# Patient Record
Sex: Female | Born: 1965 | Race: White | Hispanic: No | Marital: Single | Smoking: Never smoker
Health system: Southern US, Community
[De-identification: ages and names within clinical notes are randomized; demographics above are authoritative.]

## PROBLEM LIST (undated history)

## (undated) HISTORY — PX: TONSILLECTOMY: SUR1361

---

## 2016-01-29 ENCOUNTER — Emergency Department
Admission: EM | Admit: 2016-01-29 | Discharge: 2016-01-29 | Disposition: A | Discharge status: Home / Self Care | Payer: Managed Care, Other (non HMO) | Attending: Student | Admitting: Student

## 2016-01-29 ENCOUNTER — Emergency Department: Payer: Managed Care, Other (non HMO)

## 2016-01-29 ENCOUNTER — Encounter: Payer: Self-pay | Admitting: *Deleted

## 2016-01-29 DIAGNOSIS — Y92410 Unspecified street and highway as the place of occurrence of the external cause: Secondary | ICD-10-CM | POA: Insufficient documentation

## 2016-01-29 DIAGNOSIS — M5412 Radiculopathy, cervical region: Secondary | ICD-10-CM

## 2016-01-29 DIAGNOSIS — Y998 Other external cause status: Secondary | ICD-10-CM | POA: Insufficient documentation

## 2016-01-29 DIAGNOSIS — G44319 Acute post-traumatic headache, not intractable: Secondary | ICD-10-CM

## 2016-01-29 DIAGNOSIS — Y9389 Activity, other specified: Secondary | ICD-10-CM | POA: Diagnosis not present

## 2016-01-29 DIAGNOSIS — S0990XA Unspecified injury of head, initial encounter: Secondary | ICD-10-CM | POA: Diagnosis not present

## 2016-01-29 DIAGNOSIS — M542 Cervicalgia: Secondary | ICD-10-CM

## 2016-01-29 DIAGNOSIS — M5442 Lumbago with sciatica, left side: Secondary | ICD-10-CM

## 2016-01-29 DIAGNOSIS — Z880 Allergy status to penicillin: Secondary | ICD-10-CM | POA: Diagnosis not present

## 2016-01-29 MED ORDER — NAPROXEN 500 MG PO TABS
ORAL_TABLET | ORAL | Status: AC
  Administered 2016-01-29: 500 mg via ORAL
  Filled 2016-01-29: qty 1

## 2016-01-29 MED ORDER — NAPROXEN 500 MG PO TABS
500.0000 mg | ORAL_TABLET | Freq: Once | ORAL | Status: AC
  Administered 2016-01-29: 500 mg via ORAL
  Filled 2016-01-29: qty 1

## 2016-01-29 MED ORDER — TRAMADOL HCL 50 MG PO TABS: 50.0000 mg | ORAL_TABLET | Freq: Four times a day (QID) | ORAL | Status: DC | PRN

## 2016-01-29 MED ORDER — ONDANSETRON 8 MG PO TBDP
8.0000 mg | ORAL_TABLET | Freq: Once | ORAL | Status: AC
  Administered 2016-01-29: 8 mg via ORAL
  Filled 2016-01-29: qty 1

## 2016-01-29 MED ORDER — DIAZEPAM 5 MG PO TABS: 5.0000 mg | ORAL_TABLET | Freq: Three times a day (TID) | ORAL | Status: DC | PRN

## 2016-01-29 MED ORDER — MELOXICAM 15 MG PO TABS: 15.0000 mg | ORAL_TABLET | Freq: Every day | ORAL | Status: DC
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
## 2016-07-12 ENCOUNTER — Encounter: Payer: Self-pay | Admitting: Family Medicine

## 2016-07-27 ENCOUNTER — Encounter: Payer: Self-pay | Admitting: Physician Assistant

## 2016-07-27 ENCOUNTER — Ambulatory Visit: Payer: Self-pay | Admitting: Physician Assistant

## 2016-07-27 VITALS — BP 130/85 | HR 69 | Temp 99.1°F

## 2016-07-27 DIAGNOSIS — H1030 Unspecified acute conjunctivitis, unspecified eye: Secondary | ICD-10-CM

## 2016-07-27 DIAGNOSIS — Z9109 Other allergy status, other than to drugs and biological substances: Secondary | ICD-10-CM

## 2016-07-27 MED ORDER — MONTELUKAST SODIUM 10 MG PO TABS: 10.0000 mg | ORAL_TABLET | Freq: Every day | ORAL | 3 refills | Status: DC

## 2016-07-27 MED ORDER — METHYLPREDNISOLONE 4 MG PO TBPK: ORAL_TABLET | ORAL | 0 refills | Status: DC

## 2016-07-27 MED ORDER — TOBRAMYCIN 0.3 % OP SOLN: 2.0000 [drp] | OPHTHALMIC | 0 refills | Status: DC

## 2016-07-27 NOTE — Progress Notes (Addendum)
S: c/o runny nose, congestion, watery eyes, some sinus pressure, sx for about 2-3 weeks, started after cleaning person used clorox in building, sx decrease after leaving the building, even if only for a few minutes, denies fever/chills/body aches, cough, cp/sob, or v/d  O: vitals wnl, nad, perrl eomi, conjunctiva bright red with clear drainage, tms dull, nasal mucosa swollen and boggy, throat wnl, neck supple no lymph, lungs c t a, cv rrr  A: acute  Allergies, allergic conjunctivitis  P: saline nasal rinse, singulair, tobramycin, medrol dose pack, if not improving by Friday will call in opth prednisone drops  Pt called , eye is still red, called in prednisone eye drops

## 2016-08-02 MED ORDER — PREDNISOLONE ACETATE 1 % OP SUSP: 1.0000 [drp] | Freq: Four times a day (QID) | OPHTHALMIC | 0 refills | Status: DC

## 2016-08-02 NOTE — Addendum Note (Signed)
Addended by: Faythe GheeFISHER, SUSAN W on: 08/02/2016 02:07 PM   Modules accepted: Orders

## 2016-08-12 ENCOUNTER — Encounter: Payer: Self-pay | Admitting: Obstetrics and Gynecology

## 2016-08-19 ENCOUNTER — Encounter: Payer: Self-pay | Admitting: Physician Assistant

## 2016-08-19 ENCOUNTER — Ambulatory Visit: Payer: Self-pay | Admitting: Physician Assistant

## 2016-08-19 VITALS — BP 150/92 | HR 64 | Temp 98.9°F

## 2016-08-19 DIAGNOSIS — Z9109 Other allergy status, other than to drugs and biological substances: Secondary | ICD-10-CM

## 2016-08-19 MED ORDER — AZITHROMYCIN 250 MG PO TABS: ORAL_TABLET | ORAL | 0 refills | Status: DC

## 2016-08-19 NOTE — Progress Notes (Signed)
S: pt returns due to concerns of mold at work, was previously seen for an allergic reaction to chemicals and ?mold in work place, was told its not workers comp, Hospital doctorhvac person cleaned the units and she saw a lot of black and green goo coming out of them, states as soon as he cleaned it she got a little better, her eyes have cleared up but she still can't breath and gets really congested while at work, also gets headaches and blurred vision, states it doesn't happen anywhere else, only at work, is still having a lot of sinus pain and swelling, denies fever/chills, is still taking singulair, using prednisone eye drops once a day  O: vitals wnl, nad, perrl eom, + periorbital edema, tms clear, nasal mucosa red and swollen, throat wn, neck supple no lymph, lungs c t a, cv rrr,   A: environmental allergies, sinusitis  P: zpack, f/u with ENT, continue singulair, stop prednisone eye drops as she has been using them too long, discuss cncerns with Virl DiamondMichelle Zaiser even though I don't believe its a workers comp issue they still may want to check the air quality at her work station

## 2016-09-07 ENCOUNTER — Emergency Department
Admission: EM | Admit: 2016-09-07 | Discharge: 2016-09-07 | Disposition: A | Discharge status: Home / Self Care | Payer: Managed Care, Other (non HMO) | Attending: Emergency Medicine | Admitting: Emergency Medicine

## 2016-09-07 ENCOUNTER — Emergency Department: Payer: Managed Care, Other (non HMO)

## 2016-09-07 DIAGNOSIS — J321 Chronic frontal sinusitis: Secondary | ICD-10-CM

## 2016-09-07 DIAGNOSIS — Z792 Long term (current) use of antibiotics: Secondary | ICD-10-CM | POA: Diagnosis not present

## 2016-09-07 DIAGNOSIS — R55 Syncope and collapse: Secondary | ICD-10-CM | POA: Diagnosis not present

## 2016-09-07 DIAGNOSIS — R51 Headache: Secondary | ICD-10-CM | POA: Diagnosis present

## 2016-09-07 DIAGNOSIS — Z79899 Other long term (current) drug therapy: Secondary | ICD-10-CM | POA: Diagnosis not present

## 2016-09-07 LAB — BASIC METABOLIC PANEL
Anion gap: 10 (ref 5–15)
BUN: 19 mg/dL (ref 6–20)
CO2: 25 mmol/L (ref 22–32)
CREATININE: 0.73 mg/dL (ref 0.44–1.00)
Calcium: 9.4 mg/dL (ref 8.9–10.3)
Chloride: 103 mmol/L (ref 101–111)
Glucose, Bld: 91 mg/dL (ref 65–99)
Potassium: 3.7 mmol/L (ref 3.5–5.1)
Sodium: 138 mmol/L (ref 135–145)

## 2016-09-07 LAB — CBC WITH DIFFERENTIAL/PLATELET
BASOS ABS: 0.1 10*3/uL (ref 0–0.1)
BASOS PCT: 1 %
EOS ABS: 0.1 10*3/uL (ref 0–0.7)
EOS PCT: 2 %
HCT: 45.2 % (ref 35.0–47.0)
Hemoglobin: 16 g/dL (ref 12.0–16.0)
Lymphocytes Relative: 31 %
Lymphs Abs: 2 10*3/uL (ref 1.0–3.6)
MCH: 33 pg (ref 26.0–34.0)
MCHC: 35.4 g/dL (ref 32.0–36.0)
MCV: 93.2 fL (ref 80.0–100.0)
Monocytes Absolute: 0.5 10*3/uL (ref 0.2–0.9)
Monocytes Relative: 9 %
NEUTROS PCT: 57 %
Neutro Abs: 3.7 10*3/uL (ref 1.4–6.5)
PLATELETS: 266 10*3/uL (ref 150–440)
RBC: 4.85 MIL/uL (ref 3.80–5.20)
RDW: 13.1 % (ref 11.5–14.5)
WBC: 6.3 10*3/uL (ref 3.6–11.0)

## 2016-09-07 MED ORDER — LORAZEPAM 2 MG/ML IJ SOLN
1.0000 mg | Freq: Once | INTRAMUSCULAR | Status: AC
  Administered 2016-09-07: 1 mg via INTRAVENOUS
  Filled 2016-09-07: qty 1

## 2016-09-07 MED ORDER — PREDNISONE 20 MG PO TABS: 40.0000 mg | ORAL_TABLET | Freq: Every day | ORAL | 0 refills | Status: DC

## 2016-09-07 MED ORDER — KETOROLAC TROMETHAMINE 30 MG/ML IJ SOLN
15.0000 mg | INTRAMUSCULAR | Status: AC
  Administered 2016-09-07: 15 mg via INTRAVENOUS
  Filled 2016-09-07: qty 1

## 2016-09-07 MED ORDER — DOXYCYCLINE HYCLATE 100 MG PO CAPS: 100.0000 mg | ORAL_CAPSULE | Freq: Two times a day (BID) | ORAL | 0 refills | Status: DC

## 2016-09-07 MED ORDER — SODIUM CHLORIDE 0.9 % IV BOLUS (SEPSIS)
1000.0000 mL | Freq: Once | INTRAVENOUS | Status: AC
  Administered 2016-09-07: 1000 mL via INTRAVENOUS

## 2016-09-07 MED ORDER — ONDANSETRON 4 MG PO TBDP: 4.0000 mg | ORAL_TABLET | Freq: Three times a day (TID) | ORAL | 0 refills | Status: DC | PRN

## 2016-09-07 NOTE — ED Triage Notes (Signed)
Pt bib EMS from work w/ c/o syncopal episode.  Per EMS, pt has been experiencing incr dizziness/"fogginess" at work.  Pt sts that she has mold in her workspace that has not been dealt w/ and attributes s/s to that mold.  Pt alert and oriented in triage.  Pts syncopal episode was witnessed by coworkers, no head injury.
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
## 2016-09-07 NOTE — Progress Notes (Signed)
Patient ID: Tiffany Pope, female   DOB: 06/30/1966, 50 y.o.   MRN: [id number] Patient has been referred to see Dr. Andee PolesVaught at [facility] on 09/09/2016 @ 9am.  I have left a message with the appointment on the patient's voicemail.

## 2019-07-06 ENCOUNTER — Emergency Department: Payer: Self-pay

## 2019-07-06 ENCOUNTER — Other Ambulatory Visit: Payer: Self-pay

## 2019-07-06 ENCOUNTER — Emergency Department
Admission: EM | Admit: 2019-07-06 | Discharge: 2019-07-06 | Disposition: A | Discharge status: Home / Self Care | Payer: Self-pay | Attending: Emergency Medicine | Admitting: Emergency Medicine

## 2019-07-06 DIAGNOSIS — Z79899 Other long term (current) drug therapy: Secondary | ICD-10-CM | POA: Insufficient documentation

## 2019-07-06 DIAGNOSIS — R42 Dizziness and giddiness: Secondary | ICD-10-CM | POA: Insufficient documentation

## 2019-07-06 LAB — BASIC METABOLIC PANEL
Anion gap: 9 (ref 5–15)
BUN: 14 mg/dL (ref 6–20)
CO2: 23 mmol/L (ref 22–32)
Calcium: 9.1 mg/dL (ref 8.9–10.3)
Chloride: 107 mmol/L (ref 98–111)
Creatinine, Ser: 0.66 mg/dL (ref 0.44–1.00)
GFR calc Af Amer: >60 mL/min (ref >60)
GFR calc non Af Amer: >60 mL/min (ref >60)
Glucose, Bld: 114 mg/dL — ABNORMAL HIGH (ref 70–99)
Potassium: 3.7 mmol/L (ref 3.5–5.1)
Sodium: 139 mmol/L (ref 135–145)

## 2019-07-06 LAB — URINALYSIS, COMPLETE (UACMP) WITH MICROSCOPIC
Bilirubin Urine: NEGATIVE
Glucose, UA: NEGATIVE mg/dL
Hgb urine dipstick: NEGATIVE
Ketones, ur: 5 mg/dL — AB
Leukocytes,Ua: NEGATIVE
Nitrite: NEGATIVE
Protein, ur: NEGATIVE mg/dL
Specific Gravity, Urine: 1.027 (ref 1.005–1.030)
pH: 5 (ref 5.0–8.0)

## 2019-07-06 LAB — CBC
HCT: 43.1 % (ref 36.0–46.0)
Hemoglobin: 15.2 g/dL — ABNORMAL HIGH (ref 12.0–15.0)
MCH: 32.1 pg (ref 26.0–34.0)
MCHC: 35.3 g/dL (ref 30.0–36.0)
MCV: 90.9 fL (ref 80.0–100.0)
Platelets: 273 10*3/uL (ref 150–400)
RBC: 4.74 MIL/uL (ref 3.87–5.11)
RDW: 12.3 % (ref 11.5–15.5)
WBC: 5.4 10*3/uL (ref 4.0–10.5)
nRBC: 0 % (ref 0.0–0.2)

## 2019-07-06 LAB — TROPONIN I (HIGH SENSITIVITY): Troponin I (High Sensitivity): <2 ng/L (ref <18)

## 2019-07-06 MED ORDER — ASPIRIN EC 81 MG PO TBEC
81.0000 mg | DELAYED_RELEASE_TABLET | Freq: Once | ORAL | Status: AC
  Administered 2019-07-06: 81 mg via ORAL
  Filled 2019-07-06: qty 1

## 2019-07-06 NOTE — ED Notes (Signed)
Pt states she has completely cut out sugars and carbs within the last two weeks.
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
## 2019-07-06 NOTE — ED Notes (Signed)
Reports extreme fatigue the last couple of weeks. Unable to stand the heat. Cut out carbs, sugars, and dairy X2 weeks ago.

## 2019-07-06 NOTE — Discharge Instructions (Signed)
Follow-up by setting up an appointment with her primary care doctor and cardiologist  Stay well-hydrated.  Return to ER right away should he notice weakness or numbness in the arm, face or legs, chest pain, severe headache, feel as though he could pass out once again, or other new concerns arise.  I would recommend starting a baby aspirin 81 mg by mouth once daily at least until you have had to follow-up with a primary care and cardiologist.

## 2019-07-06 NOTE — ED Notes (Signed)
Pt screened for MRI. Denies needing meds for claustrophobia

## 2019-07-06 NOTE — ED Triage Notes (Signed)
First Nurse Note: Pt reports dizziness and dry mouth since this AM. Pt also reports her throat has been constricting in a spasm manner. Pt A&O, airway intact, speaking in complete sentences with no difficulty or distress.

## 2020-12-29 ENCOUNTER — Other Ambulatory Visit: Payer: Self-pay

## 2020-12-29 ENCOUNTER — Emergency Department
Admission: EM | Admit: 2020-12-29 | Discharge: 2020-12-29 | Disposition: A | Discharge status: Home / Self Care | Payer: No Typology Code available for payment source | Attending: Student in an Organized Health Care Education/Training Program | Admitting: Student in an Organized Health Care Education/Training Program

## 2020-12-29 ENCOUNTER — Encounter: Payer: Self-pay | Admitting: Emergency Medicine

## 2020-12-29 DIAGNOSIS — R059 Cough, unspecified: Secondary | ICD-10-CM | POA: Insufficient documentation

## 2020-12-29 DIAGNOSIS — R42 Dizziness and giddiness: Secondary | ICD-10-CM | POA: Insufficient documentation

## 2020-12-29 DIAGNOSIS — R070 Pain in throat: Secondary | ICD-10-CM | POA: Diagnosis present

## 2020-12-29 DIAGNOSIS — R519 Headache, unspecified: Secondary | ICD-10-CM | POA: Diagnosis not present

## 2020-12-29 DIAGNOSIS — H5710 Ocular pain, unspecified eye: Secondary | ICD-10-CM | POA: Insufficient documentation

## 2020-12-29 DIAGNOSIS — Z5321 Procedure and treatment not carried out due to patient leaving prior to being seen by health care provider: Secondary | ICD-10-CM | POA: Diagnosis not present

## 2020-12-29 DIAGNOSIS — R0789 Other chest pain: Secondary | ICD-10-CM | POA: Insufficient documentation

## 2020-12-29 LAB — BASIC METABOLIC PANEL
Anion gap: 9 (ref 5–15)
BUN: 19 mg/dL (ref 6–20)
CO2: 27 mmol/L (ref 22–32)
Calcium: 9.4 mg/dL (ref 8.9–10.3)
Chloride: 102 mmol/L (ref 98–111)
Creatinine, Ser: 0.71 mg/dL (ref 0.44–1.00)
GFR, Estimated: >60 mL/min (ref >60)
Glucose, Bld: 136 mg/dL — ABNORMAL HIGH (ref 70–99)
Potassium: 4.1 mmol/L (ref 3.5–5.1)
Sodium: 138 mmol/L (ref 135–145)

## 2020-12-29 LAB — CBC
HCT: 44.5 % (ref 36.0–46.0)
Hemoglobin: 15.6 g/dL — ABNORMAL HIGH (ref 12.0–15.0)
MCH: 32.4 pg (ref 26.0–34.0)
MCHC: 35.1 g/dL (ref 30.0–36.0)
MCV: 92.5 fL (ref 80.0–100.0)
Platelets: 265 10*3/uL (ref 150–400)
RBC: 4.81 MIL/uL (ref 3.87–5.11)
RDW: 11.9 % (ref 11.5–15.5)
WBC: 5.1 10*3/uL (ref 4.0–10.5)
nRBC: 0 % (ref 0.0–0.2)

## 2020-12-29 NOTE — ED Triage Notes (Signed)
Pt to ER states that she has been working from room and has just recently returned to the office.  States that at night they are fogging the office as part of a COVID protocol.  States that when she goes to the office she begins to have burning of her eyes and throat, cough, headache and today had foggy headedness and chest discomfort.  States her supervisor asked her to come and get evaluated.

## 2021-01-22 IMAGING — MR MRI HEAD WITHOUT CONTRAST
9 of 10 series · 42 of 48 positions shown · non-contrast
Comparison: CT head 09/07/2016.

CLINICAL DATA: 52-year-old female with dizziness, left arm
weakness, numbness and tingling.

EXAM:
MRI HEAD WITHOUT CONTRAST
TECHNIQUE: Multiplanar, multiecho pulse sequences of the brain and surrounding
structures were obtained without intravenous contrast.

[Series 2: ax dwi_tracew · axial · 3.0mm · 0.83mm/px · z∈[-114,+48]mm · 7 of 55 slices shown]
[im 1/55]
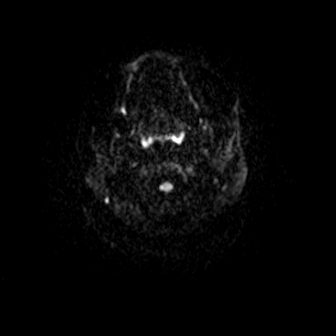
[im 10/55]
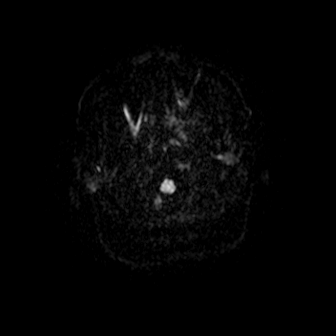
[im 19/55]
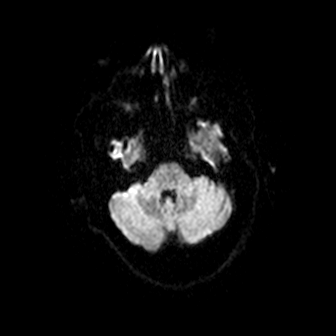
[im 28/55]
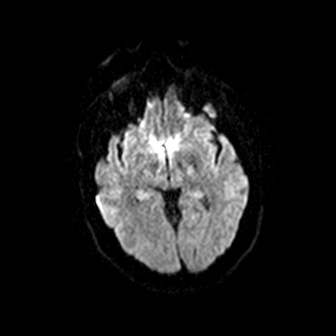
[im 37/55]
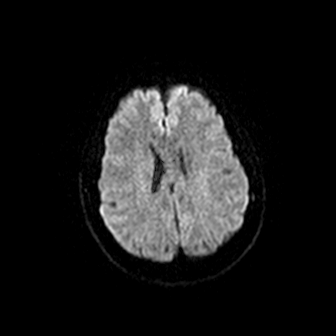
[im 46/55]
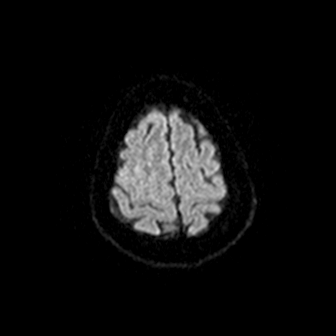
[im 55/55]
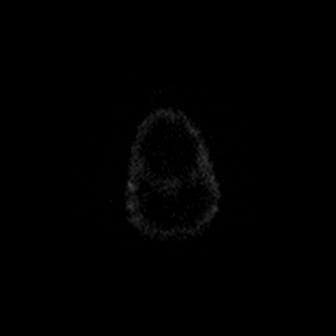

[Series 3: ax dwi_adc · axial · 3.0mm · 0.83mm/px · z∈[-114,+48]mm · 8 of 55 slices shown]
[im 1/55]
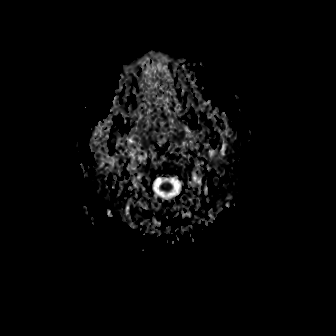
[im 8/55]
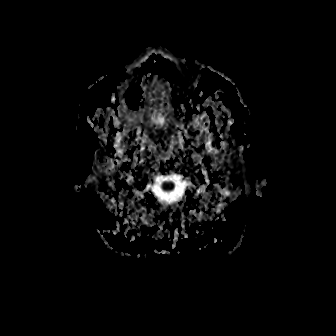
[im 16/55]
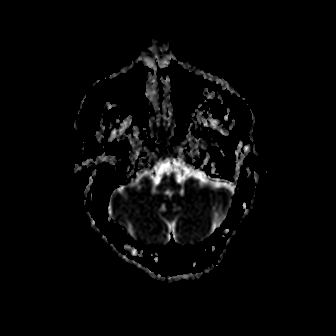
[im 24/55]
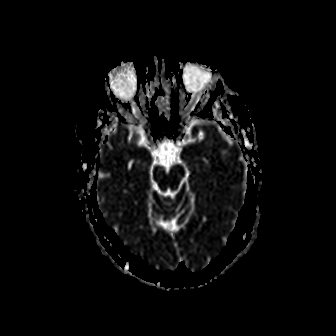
[im 31/55]
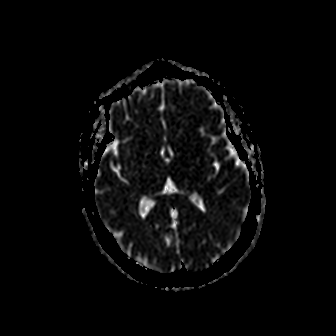
[im 39/55]
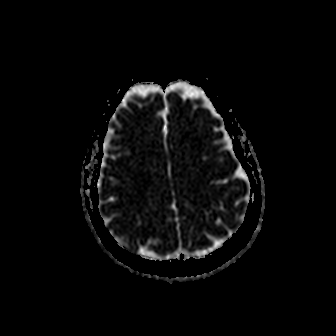
[im 47/55]
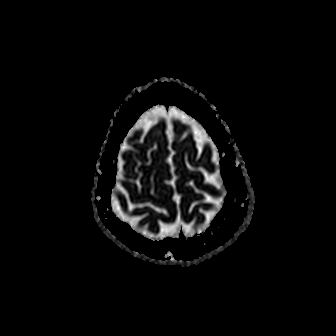
[im 55/55]
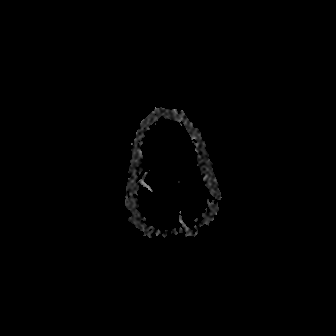

[Series 4: cor dwi_tracew · coronal · 5.0mm · 0.68mm/px · 5 of 38 slices shown]
[im 1/38]
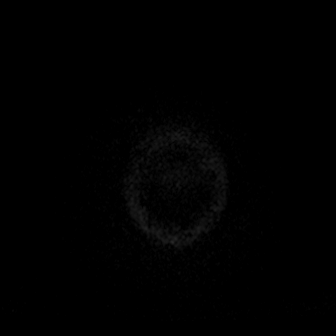
[im 10/38]
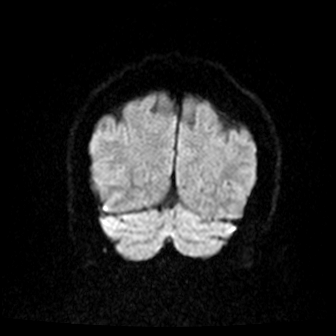
[im 19/38]
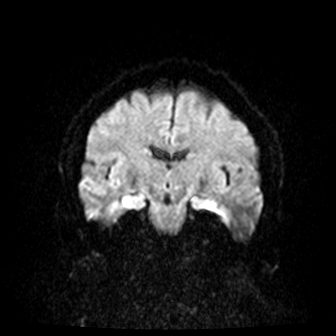
[im 28/38]
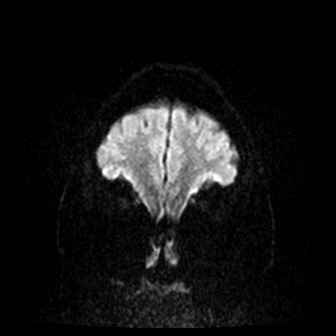
[im 38/38]
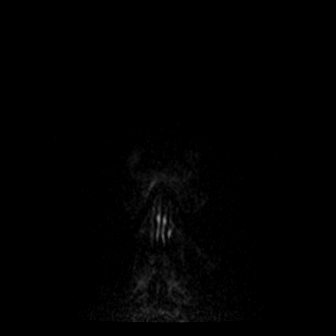

[Series 5: cor dwi_adc · coronal · 5.0mm · 0.68mm/px · 3 of 38 slices shown]
[im 1/38]
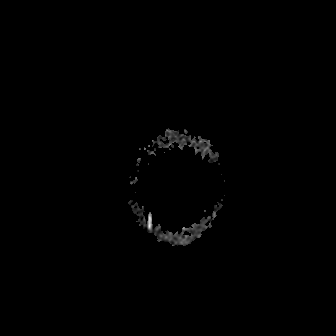
[im 10/38]
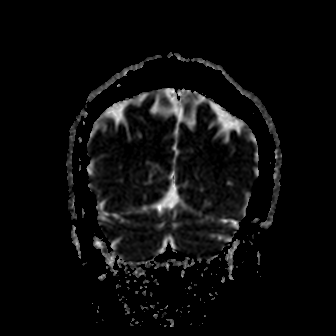
[im 19/38]
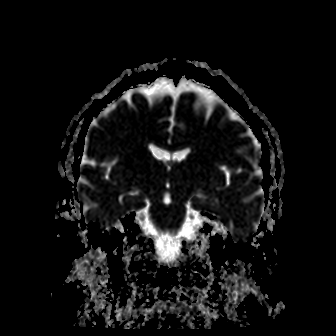

[Series 6: T1 · sagittal · 5.0mm · 0.94mm/px · 3 of 22 slices shown (1 of 2)]
[im 1/22]
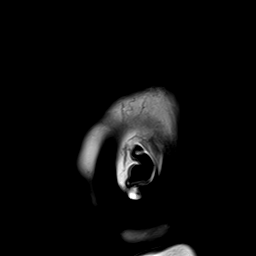
[im 11/22]
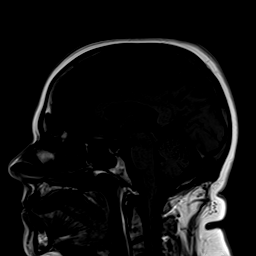
[im 22/22]
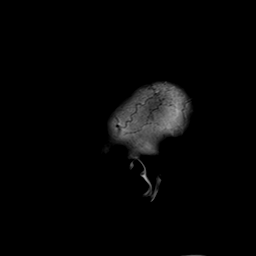

[Series 7: T2 · axial · 5.0mm · 0.45mm/px · z∈[-110,+46]mm · 4 of 27 slices shown (1 of 2)]
[im 1/27]
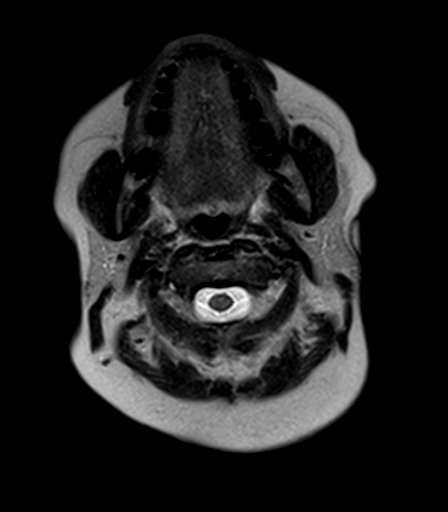
[im 9/27]
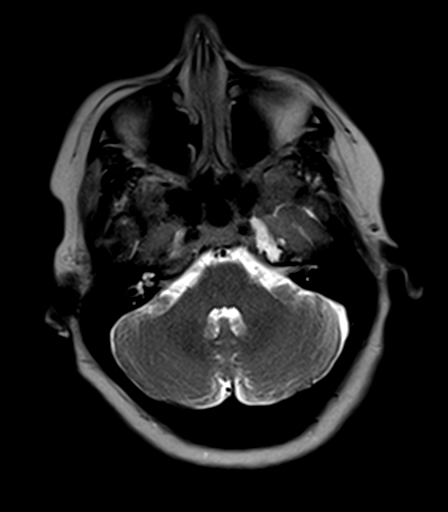
[im 18/27]
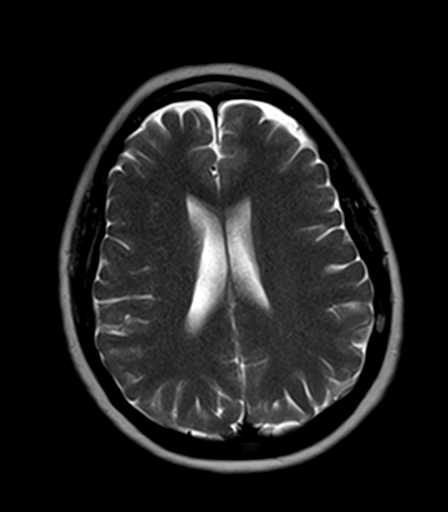
[im 27/27]
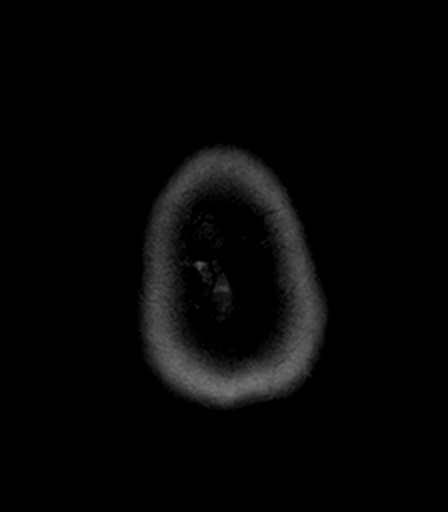

[Series 9: FLAIR · axial · 5.0mm · 1.20mm/px · z∈[-110,+46]mm · 4 of 27 slices shown]
[im 1/27]
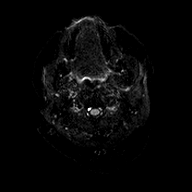
[im 9/27]
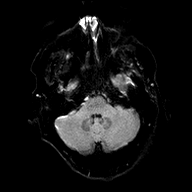
[im 18/27]
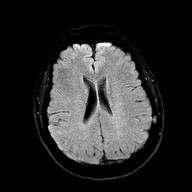
[im 27/27]
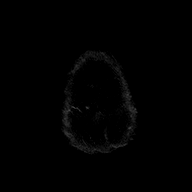

[Series 10: T1 · axial · 5.0mm · 0.90mm/px · z∈[-110,+46]mm · 4 of 27 slices shown (2 of 2)]
[im 1/27]
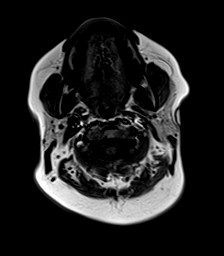
[im 9/27]
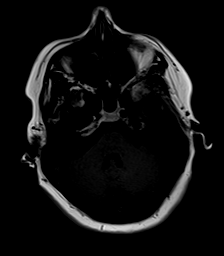
[im 18/27]
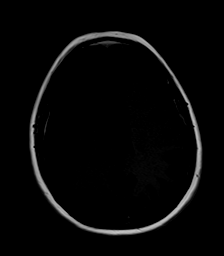
[im 27/27]
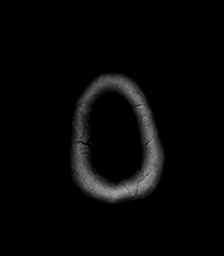

[Series 11: T2 · coronal · 5.0mm · 0.45mm/px · 4 of 31 slices shown (2 of 2)]
[im 1/31]
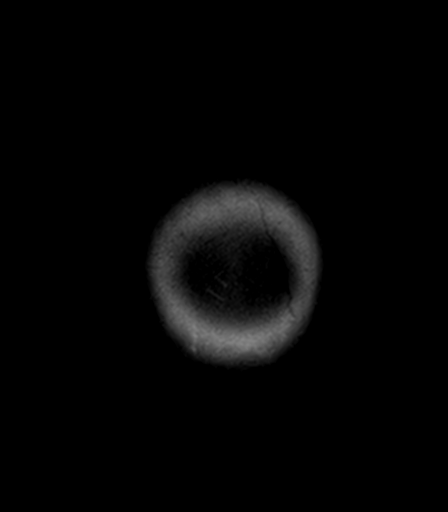
[im 11/31]
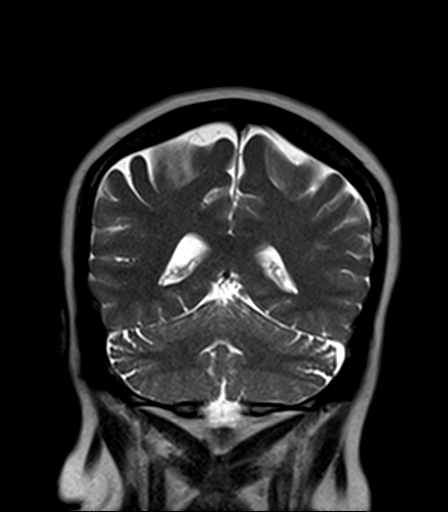
[im 21/31]
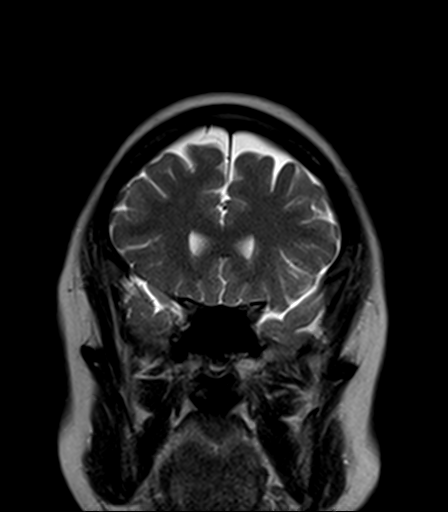
[im 31/31]
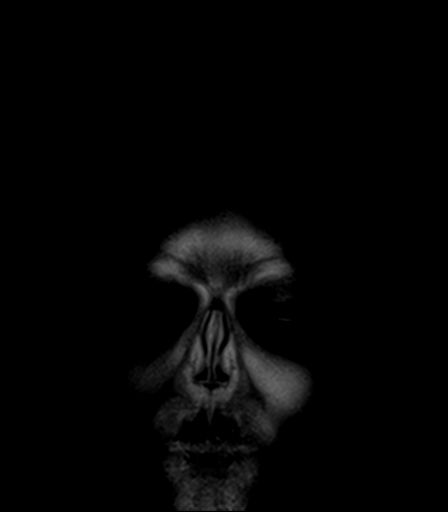

[42 of 48 positions shown; findings below may reference images not displayed]

FINDINGS: Brain: No restricted diffusion to suggest acute infarction. No
midline shift, mass effect, evidence of mass lesion,
ventriculomegaly, extra-axial collection or acute intracranial
hemorrhage. Cervicomedullary junction within normal limits.

Gray and white matter signal is within normal limits for age
throughout the brain. No encephalomalacia or chronic blood products
identified. Partially empty sella.

Vascular: Major intracranial vascular flow voids are preserved.

Skull and upper cervical spine: Negative visible cervical spine.
Visualized bone marrow signal is within normal limits.

Sinuses/Orbits: Negative orbits. Mild maxillary sinus mucosal
thickening.

Other: Mastoids are clear. Grossly negative visible internal
auditory structures. Scalp and face soft tissues appear negative.
IMPRESSION: Normal noncontrast MRI appearance of the brain.

## 2021-04-17 ENCOUNTER — Other Ambulatory Visit: Payer: Self-pay

## 2021-04-17 ENCOUNTER — Other Ambulatory Visit (HOSPITAL_COMMUNITY)
Admission: RE | Admit: 2021-04-17 | Discharge: 2021-04-17 | Disposition: A | Discharge status: Home / Self Care | Payer: Medicaid Other | Source: Ambulatory Visit | Attending: Advanced Practice Midwife | Admitting: Advanced Practice Midwife

## 2021-04-17 ENCOUNTER — Encounter: Payer: Self-pay | Admitting: Advanced Practice Midwife

## 2021-04-17 ENCOUNTER — Ambulatory Visit (INDEPENDENT_AMBULATORY_CARE_PROVIDER_SITE_OTHER): Payer: Medicaid Other | Admitting: Advanced Practice Midwife

## 2021-04-17 VITALS — BP 144/90 | Ht 65.0 in | Wt 264.0 lb

## 2021-04-17 DIAGNOSIS — Z01419 Encounter for gynecological examination (general) (routine) without abnormal findings: Secondary | ICD-10-CM | POA: Diagnosis not present

## 2021-04-17 DIAGNOSIS — Z124 Encounter for screening for malignant neoplasm of cervix: Secondary | ICD-10-CM

## 2021-04-17 DIAGNOSIS — Z1211 Encounter for screening for malignant neoplasm of colon: Secondary | ICD-10-CM

## 2021-04-17 DIAGNOSIS — L659 Nonscarring hair loss, unspecified: Secondary | ICD-10-CM

## 2021-04-17 DIAGNOSIS — Z13220 Encounter for screening for lipoid disorders: Secondary | ICD-10-CM

## 2021-04-17 DIAGNOSIS — Z131 Encounter for screening for diabetes mellitus: Secondary | ICD-10-CM

## 2021-04-17 DIAGNOSIS — N941 Unspecified dyspareunia: Secondary | ICD-10-CM

## 2021-04-17 DIAGNOSIS — Z1239 Encounter for other screening for malignant neoplasm of breast: Secondary | ICD-10-CM

## 2021-04-17 DIAGNOSIS — N951 Menopausal and female climacteric states: Secondary | ICD-10-CM

## 2021-04-17 MED ORDER — CLIMARA PRO 0.045-0.015 MG/DAY TD PTWK: 1.0000 | MEDICATED_PATCH | TRANSDERMAL | 3 refills | Status: DC

## 2021-04-18 LAB — LIPID PANEL WITH LDL/HDL RATIO
Cholesterol, Total: 297 mg/dL — ABNORMAL HIGH (ref 100–199)
HDL: 41 mg/dL (ref >39)
LDL Chol Calc (NIH): 215 mg/dL — ABNORMAL HIGH (ref 0–99)
LDL/HDL Ratio: 5.2 ratio — ABNORMAL HIGH (ref 0.0–3.2)
Triglycerides: 211 mg/dL — ABNORMAL HIGH (ref 0–149)
VLDL Cholesterol Cal: 41 mg/dL — ABNORMAL HIGH (ref 5–40)

## 2021-04-18 LAB — CBC
Hematocrit: 43.9 % (ref 34.0–46.6)
Hemoglobin: 15 g/dL (ref 11.1–15.9)
MCH: 31.5 pg (ref 26.6–33.0)
MCHC: 34.2 g/dL (ref 31.5–35.7)
MCV: 92 fL (ref 79–97)
Platelets: 301 10*3/uL (ref 150–450)
RBC: 4.76 x10E6/uL (ref 3.77–5.28)
RDW: 13.2 % (ref 11.7–15.4)
WBC: 5.9 10*3/uL (ref 3.4–10.8)

## 2021-04-18 LAB — TSH: TSH: 3.69 u[IU]/mL (ref 0.450–4.500)

## 2021-04-18 LAB — HGB A1C W/O EAG: Hgb A1c MFr Bld: 5.4 % (ref 4.8–5.6)

## 2021-04-20 ENCOUNTER — Other Ambulatory Visit: Payer: Self-pay | Admitting: Advanced Practice Midwife

## 2021-04-20 DIAGNOSIS — Z1231 Encounter for screening mammogram for malignant neoplasm of breast: Secondary | ICD-10-CM

## 2021-04-20 NOTE — Progress Notes (Signed)
Gynecology Annual Exam  Date of Service: 04/17/2021  PCP: Patient, No Pcp Per (Inactive)  Chief Complaint:  Chief Complaint  Patient presents with  . Gynecologic Exam    Weight gain, hot flashes    History of Present Illness:Patient is a 55 y.o. G2P2002 presents for annual exam. The patient has complaint today of weight gain and "no matter what I do I am unable to lose weight". She has a past history of hypothyroid and requests thyroid function test along with other routine labs today. Her last PAP was around 4 or 5 years ago and normal per her report. She has previously had her gyn care with [facility] and then moved to [place]. She reports an increase in stress in the past year which is improving. She is having hot flashes that are interfering with her quality of life and she requests HRT. She did use HRT in 2019 and found it to be helpful.     LMP: No LMP recorded. Patient is postmenopausal.   The patient is sexually active. She admits some vaginal dryness and uses coconut oil for lubrication.  The patient does perform self breast exams.  There is no notable family history of breast or ovarian cancer in her family.  The patient wears seatbelts: yes.   The patient has regular exercise: she walks some and hikes on weekends. She is following a keto diet and primarily drinks water and occasional diet soda. She usually sleeps about 5.5 hours.    The patient denies current symptoms of depression.     Review of Systems: Review of Systems  Constitutional: Negative for chills and fever.  HENT: Negative for congestion, ear discharge, ear pain, hearing loss, sinus pain and sore throat.   Eyes: Negative for blurred vision and double vision.  Respiratory: Negative for cough, shortness of breath and wheezing.        Positive for sneezing  Cardiovascular: Negative for chest pain, palpitations and leg swelling.  Gastrointestinal: Negative for abdominal pain, blood in stool, constipation,  diarrhea, heartburn, melena, nausea and vomiting.  Genitourinary: Negative for dysuria, flank pain, frequency, hematuria and urgency.  Musculoskeletal: Positive for joint pain. Negative for back pain and myalgias.       Positive for joint swelling  Skin: Negative for itching and rash.  Neurological: Positive for headaches. Negative for dizziness, tingling, tremors, sensory change, speech change, focal weakness, seizures, loss of consciousness and weakness.  Endo/Heme/Allergies: Negative for environmental allergies. Does not bruise/bleed easily.  Psychiatric/Behavioral: Negative for depression, hallucinations, memory loss, substance abuse and suicidal ideas. The patient is not nervous/anxious and does not have insomnia.     Past Medical History:  There are no problems to display for this patient.   Past Surgical History:  Past Surgical History:  Procedure Laterality Date  . CESAREAN SECTION    . TONSILLECTOMY      Gynecologic History:  No LMP recorded. Patient is postmenopausal. Last Pap: 4 or 5 years ago Results were:  no abnormalities  Last mammogram: 6 years ago Results were: BI-RAD I per patient report  Obstetric History: [id number]  Family History:  History reviewed. No pertinent family history.  Social History:  Social History   Socioeconomic History  . Marital status: Single    Spouse name: Not on file  . Number of children: Not on file  . Years of education: Not on file  . Highest education level: Not on file  Occupational History  . Not on file  Tobacco  Use  . Smoking status: Never Smoker  . Smokeless tobacco: Never Used  Vaping Use  . Vaping Use: Never used  Substance and Sexual Activity  . Alcohol use: No  . Drug use: Never  . Sexual activity: Yes    Birth control/protection: Post-menopausal  Other Topics Concern  . Not on file  Social History Narrative  . Not on file   Social Determinants of Health   Financial Resource Strain: Not on file  Food  Insecurity: Not on file  Transportation Needs: Not on file  Physical Activity: Not on file  Stress: Not on file  Social Connections: Not on file  Intimate Partner Violence: Not on file    Allergies:  Allergies  Allergen Reactions  . Molds & Smuts   . Penicillins Other (See Comments)    Medications: Prior to Admission medications   Medication Sig Start Date End Date Taking? Authorizing Provider  estradiol-levonorgestrel (CLIMARA PRO) 0.045-0.015 MG/DAY Place 1 patch onto the skin once a week. 04/17/21  Yes Tresea Mall, CNM    Physical Exam Vitals: Blood pressure (!) 144/90, height 5\' 5"  (1.651 m), weight 264 lb (119.7 kg).  General: NAD HEENT: normocephalic, anicteric Thyroid: no enlargement, no palpable nodules Pulmonary: No increased work of breathing, CTAB Cardiovascular: RRR, distal pulses 2+ Breast: Breast symmetrical, no tenderness, no palpable nodules or masses, no skin or nipple retraction present, no nipple discharge.  No axillary or supraclavicular lymphadenopathy. Abdomen: NABS, soft, non-tender, non-distended.  Umbilicus without lesions.  No hepatomegaly, splenomegaly or masses palpable. No evidence of hernia  Genitourinary:  External: Normal external female genitalia.  Normal urethral meatus, normal Bartholin's and Skene's glands.    Vagina: Normal vaginal mucosa, no evidence of prolapse.    Cervix: Grossly normal in appearance, no bleeding  Uterus: Non-enlarged, mobile, normal contour.  No CMT  Adnexa: ovaries non-enlarged, no adnexal masses  Rectal: deferred  Lymphatic: no evidence of inguinal lymphadenopathy Extremities: no edema, erythema, or tenderness Neurologic: Grossly intact Psychiatric: mood appropriate, affect full    Assessment: 55 y.o. G2P2002 routine annual exam  Plan: Problem List Items Addressed This Visit   None   Visit Diagnoses    Well woman exam with routine gynecological exam    -  Primary   Relevant Medications    estradiol-levonorgestrel (CLIMARA PRO) 0.045-0.015 MG/DAY   Other Relevant Orders   MM DIGITAL SCREENING BILATERAL   Ambulatory referral to Gastroenterology   TSH (Completed)   Hgb A1c w/o eAG (Completed)   CBC (Completed)   Lipid Panel With LDL/HDL Ratio (Completed)   Cytology - PAP   Breast screening       Relevant Orders   MM DIGITAL SCREENING BILATERAL   Screening for cervical cancer       Relevant Orders   Cytology - PAP   Colon cancer screening       Relevant Orders   Ambulatory referral to Gastroenterology   Screening cholesterol level       Relevant Orders   Lipid Panel With LDL/HDL Ratio (Completed)   Screening for diabetes mellitus       Relevant Orders   Hgb A1c w/o eAG (Completed)   Hair thinning       Relevant Orders   TSH (Completed)   Hot flashes due to menopause       Relevant Medications   estradiol-levonorgestrel (CLIMARA PRO) 0.045-0.015 MG/DAY   Dyspareunia in female       Relevant Medications   estradiol-levonorgestrel (CLIMARA PRO) 0.045-0.015 MG/DAY  1) Mammogram - recommend yearly screening mammogram.  Mammogram Was ordered today  2) STI screening  was offered and declined  3) ASCCP guidelines and rationale discussed.  Patient opts for every 3 years screening interval  4) Osteoporosis  - per USPTF routine screening DEXA at age 31  Consider FDA-approved medical therapies in postmenopausal women and men aged 57 years and older, based on the following: a) A hip or vertebral (clinical or morphometric) fracture b) T-score ? -2.5 at the femoral neck or spine after appropriate evaluation to exclude secondary causes C) Low bone mass (T-score between -1.0 and -2.5 at the femoral neck or spine) and a 10-year probability of a hip fracture ? 3% or a 10-year probability of a major osteoporosis-related fracture ? 20% based on the [place]-adapted WHO algorithm  5) Routine healthcare maintenance including cholesterol, diabetes screening discussed Ordered  today  6) Colonoscopy: referral sent today.  Screening recommended starting at age 70 for average risk individuals, age 12 for individuals deemed at increased risk (including African Americans) and recommended to continue until age 33.  For patient age 68-85 individualized approach is recommended.  Gold standard screening is via colonoscopy, Cologuard screening is an acceptable alternative for patient unwilling or unable to undergo colonoscopy.  "Colorectal cancer screening for average?risk adults: 2018 guideline update from the American Cancer Society"CA: A Cancer Journal for Clinicians: May 25, 2017   7) Return in about 1 year (around 04/17/2022) for annual established gyn.    Parke Poisson, CNM [facility] 04/20/21, 9:32 AM

## 2021-04-22 LAB — CYTOLOGY - PAP
Comment: NEGATIVE
Diagnosis: NEGATIVE
High risk HPV: NEGATIVE

## 2022-09-22 ENCOUNTER — Encounter: Payer: Self-pay | Admitting: Advanced Practice Midwife

## 2022-09-22 ENCOUNTER — Ambulatory Visit (INDEPENDENT_AMBULATORY_CARE_PROVIDER_SITE_OTHER): Payer: No Typology Code available for payment source | Admitting: Advanced Practice Midwife

## 2022-09-22 VITALS — BP 126/84 | Ht 65.0 in | Wt 234.0 lb

## 2022-09-22 DIAGNOSIS — Z0000 Encounter for general adult medical examination without abnormal findings: Secondary | ICD-10-CM

## 2022-09-22 DIAGNOSIS — Z131 Encounter for screening for diabetes mellitus: Secondary | ICD-10-CM

## 2022-09-22 DIAGNOSIS — Z77120 Contact with and (suspected) exposure to mold (toxic): Secondary | ICD-10-CM | POA: Diagnosis not present

## 2022-09-22 DIAGNOSIS — G479 Sleep disorder, unspecified: Secondary | ICD-10-CM

## 2022-09-22 DIAGNOSIS — Z13220 Encounter for screening for lipoid disorders: Secondary | ICD-10-CM

## 2022-09-22 DIAGNOSIS — Z1239 Encounter for other screening for malignant neoplasm of breast: Secondary | ICD-10-CM

## 2022-09-22 NOTE — Progress Notes (Signed)
Gynecology Annual Exam  PCP: Patient, No Pcp Per  Chief Complaint:  Chief Complaint  Patient presents with   Annual Exam    History of Present Illness:Patient is a 56 y.o. G2P2002 presents for annual exam. The patient has no gyn/breast complaints today. She reports her main concern is a severe allergic reaction she had in July she thinks from mold. Her eyes were swollen shut initially. She has taken a course of prednisone and is now using steroid eye drops. She is interested in serum testing for mold exposure. She also reports waking up multiple times during the night and feeling not well rested. Since her allergic reaction she has limited exercise and exposure to sunlight. She mentions her history of hypothyroid and request thyroid panel today.   LMP: No LMP recorded. Patient is postmenopausal. She denies bleeding  The patient is sexually active. She denies dyspareunia.  The patient does perform self breast exams.  There is no notable family history of breast or ovarian cancer in her family.  The patient wears seatbelts: yes.   The patient has regular exercise:  limited for the past several months, she admits healthy diet and adequate hydration.  She supplements selenium, calcium, vitamin D and C.   The patient denies current symptoms of depression.     Review of Systems: Review of Systems  Constitutional:  Positive for malaise/fatigue. Negative for chills and fever.  HENT:  Negative for congestion, ear discharge, ear pain, hearing loss, sinus pain and sore throat.   Eyes:  Negative for blurred vision and double vision.  Respiratory:  Negative for cough, shortness of breath and wheezing.   Cardiovascular:  Negative for chest pain, palpitations and leg swelling.  Gastrointestinal:  Negative for abdominal pain, blood in stool, constipation, diarrhea, heartburn, melena, nausea and vomiting.  Genitourinary:  Negative for dysuria, flank pain, frequency, hematuria and urgency.   Musculoskeletal:  Negative for back pain, joint pain and myalgias.  Skin:  Negative for itching and rash.       Positive for allergic reaction around eyes  Neurological:  Negative for dizziness, tingling, tremors, sensory change, speech change, focal weakness, seizures, loss of consciousness, weakness and headaches.  Endo/Heme/Allergies:  Negative for environmental allergies. Does not bruise/bleed easily.  Psychiatric/Behavioral:  Negative for depression, hallucinations, memory loss, substance abuse and suicidal ideas. The patient is not nervous/anxious and does not have insomnia.     Past Medical History:  There are no problems to display for this patient.   Past Surgical History:  Past Surgical History:  Procedure Laterality Date   CESAREAN SECTION     TONSILLECTOMY      Gynecologic History:  No LMP recorded. Patient is postmenopausal. Last Pap: 2022 Results were:  no abnormalities  Last mammogram: 6 years ago Results were: BI-RAD I per patient report  Obstetric History: [id number]  Family History:  History reviewed. No pertinent family history.  Social History:  Social History   Socioeconomic History   Marital status: Single    Spouse name: Not on file   Number of children: Not on file   Years of education: Not on file   Highest education level: Not on file  Occupational History   Not on file  Tobacco Use   Smoking status: Never   Smokeless tobacco: Never  Vaping Use   Vaping Use: Never used  Substance and Sexual Activity   Alcohol use: No   Drug use: Never   Sexual activity: Yes  Birth control/protection: Post-menopausal  Other Topics Concern   Not on file  Social History Narrative   Not on file   Social Determinants of Health   Financial Resource Strain: Not on file  Food Insecurity: Not on file  Transportation Needs: Not on file  Physical Activity: Not on file  Stress: Not on file  Social Connections: Not on file  Intimate Partner Violence: Not on  file    Allergies:  Allergies  Allergen Reactions   Molds & Smuts    Penicillins Other (See Comments)    Medications: Prior to Admission medications   Medication Sig Start Date End Date Taking? Authorizing Provider  prednisoLONE acetate (PRED FORTE) 1 % ophthalmic suspension 1 drop 4 (four) times daily. 08/12/22   [provider]    Physical Exam Vitals: Blood pressure 126/84, height 5\' 5"  (1.651 m), weight 234 lb (106.1 kg).  General: NAD HEENT: normocephalic, anicteric Thyroid: no enlargement, no palpable nodules Pulmonary: No increased work of breathing, CTAB Cardiovascular: RRR, distal pulses 2+ Breast: Breast symmetrical, no tenderness, no palpable nodules or masses, no skin or nipple retraction present, no nipple discharge.  No axillary or supraclavicular lymphadenopathy. Abdomen: NABS, soft, non-tender, non-distended.  Umbilicus without lesions.  No hepatomegaly, splenomegaly or masses palpable. No evidence of hernia  Genitourinary: deferred for no concerns/PAP interval Extremities: no edema, erythema, or tenderness Neurologic: Grossly intact Psychiatric: mood appropriate, affect full    Assessment: 56 y.o. G2P2002 routine annual exam  Plan: Problem List Items Addressed This Visit   None Visit Diagnoses     Well woman exam without gynecological exam    -  Primary   Relevant Orders   CBC   Comprehensive metabolic panel   Lipid Panel With LDL/HDL Ratio   Thyroid Panel With TSH   Hgb A1c w/o eAG   Suspected exposure to mold       Relevant Orders   Allergen Profile, Mold   Screening for diabetes mellitus       Relevant Orders   Hgb A1c w/o eAG   Sleep disturbance       Relevant Orders   Thyroid Panel With TSH   Lipid screening       Relevant Orders   Lipid Panel With LDL/HDL Ratio   Breast screening       Relevant Orders   MM 3D SCREEN BREAST BILATERAL       1) Mammogram - recommend yearly screening mammogram.  Mammogram Was ordered  today  2) STI screening  was offered and declined  3) ASCCP guidelines and rationale discussed.  Patient opts for every 5 years screening interval  4) Osteoporosis  - per USPTF routine screening DEXA at age 62  Consider FDA-approved medical therapies in postmenopausal women and men aged 18 years and older, based on the following: a) A hip or vertebral (clinical or morphometric) fracture b) T-score ? -2.5 at the femoral neck or spine after appropriate evaluation to exclude secondary causes C) Low bone mass (T-score between -1.0 and -2.5 at the femoral neck or spine) and a 10-year probability of a hip fracture ? 3% or a 10-year probability of a major osteoporosis-related fracture ? 20% based on the [place]-adapted WHO algorithm   5) Routine healthcare maintenance including cholesterol, diabetes screening discussed Ordered today. Follow up as needed after labs result. Recommend follow up with endocrinology for thyroid abnormalities and with allergist for positive mold testing.  6) Colonoscopy: no follow up on last year's referral for colonoscopy. Declines today.  Screening recommended starting at age 49 for average risk individuals, age 42 for individuals deemed at increased risk (including African Americans) and recommended to continue until age 41.  For patient age 81-85 individualized approach is recommended.  Gold standard screening is via colonoscopy, Cologuard screening is an acceptable alternative for patient unwilling or unable to undergo colonoscopy.  "Colorectal cancer screening for average?risk adults: 2018 guideline update from the American Cancer Society"CA: A Cancer Journal for Clinicians: May 25, 2017   7) Return in about 1 year (around 09/23/2023) for annual established gyn.    Christean Leaf, CNM [facility] 09/22/22, 12:21 PM

## 2022-09-24 LAB — THYROID PANEL WITH TSH
Free Thyroxine Index: 1.6 (ref 1.2–4.9)
T3 Uptake Ratio: 22 % — ABNORMAL LOW (ref 24–39)
T4, Total: 7.3 ug/dL (ref 4.5–12.0)
TSH: 2.87 u[IU]/mL (ref 0.450–4.500)

## 2022-09-24 LAB — COMPREHENSIVE METABOLIC PANEL
ALT: 29 IU/L (ref 0–32)
AST: 19 IU/L (ref 0–40)
Albumin/Globulin Ratio: 2.3 — ABNORMAL HIGH (ref 1.2–2.2)
Albumin: 4.6 g/dL (ref 3.8–4.9)
Alkaline Phosphatase: 64 IU/L (ref 44–121)
BUN/Creatinine Ratio: 23 (ref 9–23)
BUN: 20 mg/dL (ref 6–24)
Bilirubin Total: 0.4 mg/dL (ref 0.0–1.2)
CO2: 23 mmol/L (ref 20–29)
Calcium: 9.5 mg/dL (ref 8.7–10.2)
Chloride: 100 mmol/L (ref 96–106)
Creatinine, Ser: 0.86 mg/dL (ref 0.57–1.00)
Globulin, Total: 2 g/dL (ref 1.5–4.5)
Glucose: 93 mg/dL (ref 70–99)
Potassium: 4.8 mmol/L (ref 3.5–5.2)
Sodium: 140 mmol/L (ref 134–144)
Total Protein: 6.6 g/dL (ref 6.0–8.5)
eGFR: 78 mL/min/{1.73_m2} (ref >59)

## 2022-09-24 LAB — ALLERGEN PROFILE, MOLD
Alternaria Alternata IgE: <0.1 kU/L
Aspergillus Fumigatus IgE: <0.1 kU/L
Aureobasidi Pullulans IgE: <0.1 kU/L
Candida Albicans IgE: <0.1 kU/L
Cladosporium Herbarum IgE: <0.1 kU/L
M009-IgE Fusarium proliferatum: <0.1 kU/L
M014-IgE Epicoccum purpur: <0.1 kU/L
Mucor Racemosus IgE: <0.1 kU/L
Penicillium Chrysogen IgE: <0.1 kU/L
Phoma Betae IgE: <0.1 kU/L
Setomelanomma Rostrat: <0.1 kU/L
Stemphylium Herbarum IgE: <0.1 kU/L

## 2022-09-24 LAB — CBC
Hematocrit: 44.9 % (ref 34.0–46.6)
Hemoglobin: 15.5 g/dL (ref 11.1–15.9)
MCH: 32.9 pg (ref 26.6–33.0)
MCHC: 34.5 g/dL (ref 31.5–35.7)
MCV: 95 fL (ref 79–97)
Platelets: 283 10*3/uL (ref 150–450)
RBC: 4.71 x10E6/uL (ref 3.77–5.28)
RDW: 13 % (ref 11.7–15.4)
WBC: 5.1 10*3/uL (ref 3.4–10.8)

## 2022-09-24 LAB — LIPID PANEL WITH LDL/HDL RATIO
Cholesterol, Total: 351 mg/dL — ABNORMAL HIGH (ref 100–199)
HDL: 35 mg/dL — ABNORMAL LOW (ref >39)
LDL Chol Calc (NIH): 272 mg/dL — ABNORMAL HIGH (ref 0–99)
LDL/HDL Ratio: 7.8 ratio — ABNORMAL HIGH (ref 0.0–3.2)
Triglycerides: 210 mg/dL — ABNORMAL HIGH (ref 0–149)
VLDL Cholesterol Cal: 44 mg/dL — ABNORMAL HIGH (ref 5–40)

## 2022-09-24 LAB — HGB A1C W/O EAG: Hgb A1c MFr Bld: 5.7 % — ABNORMAL HIGH (ref 4.8–5.6)
